# Patient Record
Sex: Male | Born: 1940 | Race: White | Hispanic: No | Marital: Single | Smoking: Never smoker
Health system: Southern US, Community
[De-identification: ages and names within clinical notes are randomized; demographics above are authoritative.]

---

## 2015-04-19 ENCOUNTER — Emergency Department (HOSPITAL_COMMUNITY): Payer: Medicare (Managed Care)

## 2015-04-19 ENCOUNTER — Encounter (HOSPITAL_COMMUNITY): Payer: Self-pay | Admitting: Emergency Medicine

## 2015-04-19 ENCOUNTER — Emergency Department (HOSPITAL_COMMUNITY)
Admission: EM | Admit: 2015-04-19 | Discharge: 2015-04-19 | Disposition: A | Discharge status: Home / Self Care | Payer: Medicare (Managed Care) | Attending: Emergency Medicine | Admitting: Emergency Medicine

## 2015-04-19 DIAGNOSIS — K5732 Diverticulitis of large intestine without perforation or abscess without bleeding: Secondary | ICD-10-CM | POA: Diagnosis not present

## 2015-04-19 DIAGNOSIS — R110 Nausea: Secondary | ICD-10-CM | POA: Insufficient documentation

## 2015-04-19 DIAGNOSIS — R1031 Right lower quadrant pain: Secondary | ICD-10-CM | POA: Diagnosis present

## 2015-04-19 LAB — COMPREHENSIVE METABOLIC PANEL
ALT: 22 U/L (ref 17–63)
AST: 24 U/L (ref 15–41)
Albumin: 4.9 g/dL (ref 3.5–5.0)
Alkaline Phosphatase: 63 U/L (ref 38–126)
Anion gap: 10 (ref 5–15)
BUN: 13 mg/dL (ref 6–20)
CALCIUM: 9.1 mg/dL (ref 8.9–10.3)
CHLORIDE: 97 mmol/L — AB (ref 101–111)
CO2: 28 mmol/L (ref 22–32)
CREATININE: 0.94 mg/dL (ref 0.61–1.24)
Glucose, Bld: 117 mg/dL — ABNORMAL HIGH (ref 65–99)
Potassium: 4.1 mmol/L (ref 3.5–5.1)
Sodium: 135 mmol/L (ref 135–145)
TOTAL PROTEIN: 7.1 g/dL (ref 6.5–8.1)
Total Bilirubin: 1 mg/dL (ref 0.3–1.2)

## 2015-04-19 LAB — CBC
HCT: 46.8 % (ref 39.0–52.0)
Hemoglobin: 15.8 g/dL (ref 13.0–17.0)
MCH: 29.5 pg (ref 26.0–34.0)
MCHC: 33.8 g/dL (ref 30.0–36.0)
MCV: 87.5 fL (ref 78.0–100.0)
PLATELETS: 121 10*3/uL — AB (ref 150–400)
RBC: 5.35 MIL/uL (ref 4.22–5.81)
RDW: 14 % (ref 11.5–15.5)
WBC: 17.6 10*3/uL — AB (ref 4.0–10.5)

## 2015-04-19 LAB — URINALYSIS, ROUTINE W REFLEX MICROSCOPIC
Bilirubin Urine: NEGATIVE
Glucose, UA: NEGATIVE mg/dL
Hgb urine dipstick: NEGATIVE
KETONES UR: NEGATIVE mg/dL
LEUKOCYTES UA: NEGATIVE
NITRITE: NEGATIVE
PROTEIN: NEGATIVE mg/dL
Specific Gravity, Urine: 1.026 (ref 1.005–1.030)
UROBILINOGEN UA: 0.2 mg/dL (ref 0.0–1.0)
pH: 5 (ref 5.0–8.0)

## 2015-04-19 LAB — LIPASE, BLOOD: LIPASE: 24 U/L (ref 22–51)

## 2015-04-19 MED ORDER — HYDROCODONE-ACETAMINOPHEN 5-325 MG PO TABS: 1.0000 | ORAL_TABLET | ORAL | Status: AC | PRN

## 2015-04-19 MED ORDER — ACETAMINOPHEN 325 MG PO TABS
650.0000 mg | ORAL_TABLET | Freq: Once | ORAL | Status: AC
  Administered 2015-04-19: 650 mg via ORAL
  Filled 2015-04-19: qty 2

## 2015-04-19 MED ORDER — SODIUM CHLORIDE 0.9 % IV BOLUS (SEPSIS)
500.0000 mL | Freq: Once | INTRAVENOUS | Status: AC
  Administered 2015-04-19: 500 mL via INTRAVENOUS

## 2015-04-19 MED ORDER — METRONIDAZOLE 500 MG PO TABS
500.0000 mg | ORAL_TABLET | Freq: Once | ORAL | Status: AC
  Administered 2015-04-19: 500 mg via ORAL
  Filled 2015-04-19: qty 1

## 2015-04-19 MED ORDER — MORPHINE SULFATE (PF) 4 MG/ML IV SOLN
4.0000 mg | Freq: Once | INTRAVENOUS | Status: AC
  Administered 2015-04-19: 4 mg via INTRAVENOUS
  Filled 2015-04-19: qty 1

## 2015-04-19 MED ORDER — CIPROFLOXACIN IN D5W 400 MG/200ML IV SOLN
400.0000 mg | Freq: Once | INTRAVENOUS | Status: AC
  Administered 2015-04-19: 400 mg via INTRAVENOUS
  Filled 2015-04-19: qty 200

## 2015-04-19 MED ORDER — IOHEXOL 300 MG/ML  SOLN
100.0000 mL | Freq: Once | INTRAMUSCULAR | Status: AC | PRN
  Administered 2015-04-19: 100 mL via INTRAVENOUS

## 2015-04-19 MED ORDER — CIPROFLOXACIN HCL 500 MG PO TABS: 500.0000 mg | ORAL_TABLET | Freq: Two times a day (BID) | ORAL | Status: AC

## 2015-04-19 MED ORDER — ONDANSETRON HCL 4 MG/2ML IJ SOLN
4.0000 mg | Freq: Once | INTRAMUSCULAR | Status: AC
  Administered 2015-04-19: 4 mg via INTRAVENOUS
  Filled 2015-04-19: qty 2

## 2015-04-19 MED ORDER — METRONIDAZOLE 500 MG PO TABS: 500.0000 mg | ORAL_TABLET | Freq: Three times a day (TID) | ORAL | Status: AC

## 2015-04-19 MED ORDER — IOHEXOL 300 MG/ML  SOLN
25.0000 mL | Freq: Once | INTRAMUSCULAR | Status: AC | PRN
  Administered 2015-04-19: 25 mL via ORAL

## 2015-04-19 NOTE — Discharge Instructions (Signed)
Diverticulitis °Diverticulitis is inflammation or infection of small pouches in your colon that form when you have a condition called diverticulosis. The pouches in your colon are called diverticula. Your colon, or large intestine, is where water is absorbed and stool is formed. °Complications of diverticulitis can include: °· Bleeding. °· Severe infection. °· Severe pain. °· Perforation of your colon. °· Obstruction of your colon. °CAUSES  °Diverticulitis is caused by bacteria. °Diverticulitis happens when stool becomes trapped in diverticula. This allows bacteria to grow in the diverticula, which can lead to inflammation and infection. °RISK FACTORS °People with diverticulosis are at risk for diverticulitis. Eating a diet that does not include enough fiber from fruits and vegetables may make diverticulitis more likely to develop. °SYMPTOMS  °Symptoms of diverticulitis may include: °· Abdominal pain and tenderness. The pain is normally located on the left side of the abdomen, but may occur in other areas. °· Fever and chills. °· Bloating. °· Cramping. °· Nausea. °· Vomiting. °· Constipation. °· Diarrhea. °· Blood in your stool. °DIAGNOSIS  °Your health care provider will ask you about your medical history and do a physical exam. You may need to have tests done because many medical conditions can cause the same symptoms as diverticulitis. Tests may include: °· Blood tests. °· Urine tests. °· Imaging tests of the abdomen, including X-rays and CT scans. °When your condition is under control, your health care provider may recommend that you have a colonoscopy. A colonoscopy can show how severe your diverticula are and whether something else is causing your symptoms. °TREATMENT  °Most cases of diverticulitis are mild and can be treated at home. Treatment may include: °· Taking over-the-counter pain medicines. °· Following a clear liquid diet. °· Taking antibiotic medicines by mouth for 7-10 days. °More severe cases may  be treated at a hospital. Treatment may include: °· Not eating or drinking. °· Taking prescription pain medicine. °· Receiving antibiotic medicines through an IV tube. °· Receiving fluids and nutrition through an IV tube. °· Surgery. °HOME CARE INSTRUCTIONS  °· Follow your health care provider's instructions carefully. °· Follow a full liquid diet or other diet as directed by your health care provider. After your symptoms improve, your health care provider may tell you to change your diet. He or she may recommend you eat a high-fiber diet. Fruits and vegetables are good sources of fiber. Fiber makes it easier to pass stool. °· Take fiber supplements or probiotics as directed by your health care provider. °· Only take medicines as directed by your health care provider. °· Keep all your follow-up appointments. °SEEK MEDICAL CARE IF:  °· Your pain does not improve. °· You have a hard time eating food. °· Your bowel movements do not return to normal. °SEEK IMMEDIATE MEDICAL CARE IF:  °· Your pain becomes worse. °· Your symptoms do not get better. °· Your symptoms suddenly get worse. °· You have a fever. °· You have repeated vomiting. °· You have bloody or black, tarry stools. °MAKE SURE YOU:  °· Understand these instructions. °· Will watch your condition. °· Will get help right away if you are not doing well or get worse. °  °This information is not intended to replace advice given to you by your health care provider. Make sure you discuss any questions you have with your health care provider. °  °Document Released: 03/27/2005 Document Revised: 06/22/2013 Document Reviewed: 05/12/2013 °Elsevier Interactive Patient Education ©2016 [employer] ° °

## 2015-04-19 NOTE — ED Notes (Signed)
Pt. Stated, Im here for the furniture market and started having lower abdominal pain last night.  Probably a urinary thing.

## 2015-04-19 NOTE — ED Notes (Signed)
Pt in CT during rounding.

## 2015-04-19 NOTE — ED Provider Notes (Signed)
CSN: [id number]     Arrival date & time 04/19/15  1059 History   First MD Initiated Contact with Patient 04/19/15 1114     Chief Complaint  Patient presents with  . Abdominal Pain     (Consider location/radiation/quality/duration/timing/severity/associated sxs/prior Treatment) HPI Comments: Patient presents to the emergency room for abdominal pain. Patient reports that he started to experience lower abdominal pain approximately midnight last night. Since that time the pain has progressively worsened. Patient has noticed that the pain is more on the right lower side of his abdomen this morning. He reports that it worsens if he moves or presses on the area. He had increased pain when he hit bumps in the car on the ride to the ER. He has had mild nausea, no vomiting. There is no diarrhea, constipation, urinary symptoms.  Patient is a 74 y.o. male presenting with abdominal pain.  Abdominal Pain   History reviewed. No pertinent past medical history. History reviewed. No pertinent past surgical history. No family history on file. Social History  Substance Use Topics  . Smoking status: Never Smoker   . Smokeless tobacco: None  . Alcohol Use: Yes    Review of Systems  Gastrointestinal: Positive for abdominal pain.  All other systems reviewed and are negative.     Allergies  Review of patient's allergies indicates no known allergies.  Home Medications   Prior to Admission medications   Medication Sig Start Date End Date Taking? Authorizing Provider  ibuprofen (ADVIL,MOTRIN) 200 MG tablet Take 400 mg by mouth every 6 (six) hours as needed for mild pain.   Yes Historical Provider, MD   BP 130/96 mmHg  Pulse 84  Temp(Src) 98.7 F (37.1 C) (Oral)  Resp 27  Ht 5\' 9"  (1.753 m)  Wt 210 lb (95.255 kg)  BMI 31.00 kg/m2  SpO2 98% Physical Exam  Constitutional: He is oriented to person, place, and time. He appears well-developed and well-nourished. No distress.  HENT:  Head:  Normocephalic and atraumatic.  Right Ear: Hearing normal.  Left Ear: Hearing normal.  Nose: Nose normal.  Mouth/Throat: Oropharynx is clear and moist and mucous membranes are normal.  Eyes: Conjunctivae and EOM are normal. Pupils are equal, round, and reactive to light.  Neck: Normal range of motion. Neck supple.  Cardiovascular: Regular rhythm, S1 normal and S2 normal.  Exam reveals no gallop and no friction rub.   No murmur heard. Pulmonary/Chest: Effort normal and breath sounds normal. No respiratory distress. He exhibits no tenderness.  Abdominal: Soft. Normal appearance and bowel sounds are normal. There is no hepatosplenomegaly. There is tenderness in the right lower quadrant. There is guarding. There is no rebound, no tenderness at McBurney's point and negative Murphy's sign. No hernia.  Musculoskeletal: Normal range of motion.  Neurological: He is alert and oriented to person, place, and time. He has normal strength. No cranial nerve deficit or sensory deficit. Coordination normal. GCS eye subscore is 4. GCS verbal subscore is 5. GCS motor subscore is 6.  Skin: Skin is warm, dry and intact. No rash noted. No cyanosis.  Psychiatric: He has a normal mood and affect. His speech is normal and behavior is normal. Thought content normal.  Nursing note and vitals reviewed.   ED Course  Procedures (including critical care time) Labs Review Labs Reviewed  COMPREHENSIVE METABOLIC PANEL - Abnormal; Notable for the following:    Chloride 97 (*)    Glucose, Bld 117 (*)    All other components within normal limits  CBC - Abnormal; Notable for the following:    WBC 17.6 (*)    Platelets 121 (*)    All other components within normal limits  URINALYSIS, ROUTINE W REFLEX MICROSCOPIC (NOT AT [facility]) - Abnormal; Notable for the following:    Color, Urine AMBER (*)    All other components within normal limits  LIPASE, BLOOD    Imaging Review Ct Abdomen Pelvis W Contrast  04/19/2015  CLINICAL  DATA:  Lower abdominal pain since last night. No nausea or vomiting. EXAM: CT ABDOMEN AND PELVIS WITH CONTRAST TECHNIQUE: Multidetector CT imaging of the abdomen and pelvis was performed using the standard protocol following bolus administration of intravenous contrast. CONTRAST:  OMNIPAQUE IOHEXOL 300 MG/ML  SOLN COMPARISON:  None. FINDINGS: Lower chest: Mild chronic interstitial disease at the lung bases. Normal heart size. Hepatobiliary: 8.7 mm hypodensity in the right hepatic lobe too small to characterize likely representing a small cyst. Liver is otherwise normal. Normal gallbladder. No intrahepatic or extrahepatic biliary ductal dilatation. Pancreas: Normal. Spleen: Normal. Adrenals/Urinary Tract: Normal adrenal glands. Normal right kidney. 2.3 cm hypodense, fluid attenuating left renal mass, most consistent with a cyst. Bladder wall thickening. Stomach/Bowel: No bowel dilatation to suggest obstruction. Diverticulosis of the sigmoid colon with perisigmoid ule inflammatory changes most consistent with acute diverticulitis. Normal appendix. There is no focal fluid collection to suggest an abscess. There is no pneumatosis, pneumoperitoneum portal venous gas. There is no abdominal or pelvic free fluid. Vascular/Lymphatic: Normal caliber abdominal aorta with atherosclerosis. No abdominal or pelvic lymphadenopathy. Other: No focal fluid collection or hematoma. Musculoskeletal: No lytic or sclerotic osseous lesion. No acute osseous abnormality. Mild degenerative changes of bilateral SI joints. Grade 1 anterolisthesis of L4 on L5 secondary to bilateral facet arthropathy. Severe bilateral facet arthropathy at L4-5 and L5-S1. Degenerative disc disease with disc height loss and a broad-based disc bulge at L5-S1. IMPRESSION: 1. Acute diverticulitis of the sigmoid colon. No focal fluid collection to suggest an abscess. The area of inflammation is adjacent to the dome of the bladder and likely resulting in reactive  bladder wall thickening. 2. Lumbar spine spondylosis. Electronically Signed   By: Elige Ko   On: 04/19/2015 14:00   I have personally reviewed and evaluated these images and lab results as part of my medical decision-making.   EKG Interpretation None      MDM   Final diagnoses:  Diverticulitis of large intestine without perforation or abscess without bleeding    Patient presents to the emergency for evaluation of abdominal pain. Patient began to experience lower abdominal pain last night. Patient reports that the pain is worsened today and has become more focal to the right lower abdomen. Examination revealed tenderness and guarding in the right lower quadrant with diffuse lower abdominal tenderness on bilateral sides. Patient does have leukocytosis. He is afebrile. Remainder of blood work was unremarkable. CT scan performed. Findings are consistent with acute sigmoid diverticulitis. Patient initiated on Cipro, Flagyl and analgesia. He is tolerating by mouth, is otherwise healthy. He is a candidate for continued outpatient treatment for acute diverticulitis.    Gilda Crease, MD 04/19/15 [phone]

## 2016-09-20 IMAGING — CT CT ABD-PELV W/ CM
2 of 5 series · 16 of 46 positions shown, 18 images · IV contrast (Omni 300)
Comparison: None.

CLINICAL DATA: Lower abdominal pain since last night. No nausea or
vomiting.

EXAM:
CT ABDOMEN AND PELVIS WITH CONTRAST
TECHNIQUE: Multidetector CT imaging of the abdomen and pelvis was performed
using the standard protocol following bolus administration of
intravenous contrast.
CONTRAST:  100mL OMNIPAQUE IOHEXOL 300 MG/ML  SOLN

[Series 2: a/p w/ 5mm · axial · 0.82mm/px · z∈[-493,-33]mm · 13 of 104 slices shown, 15 images]
[im 6/104  soft-tissue]
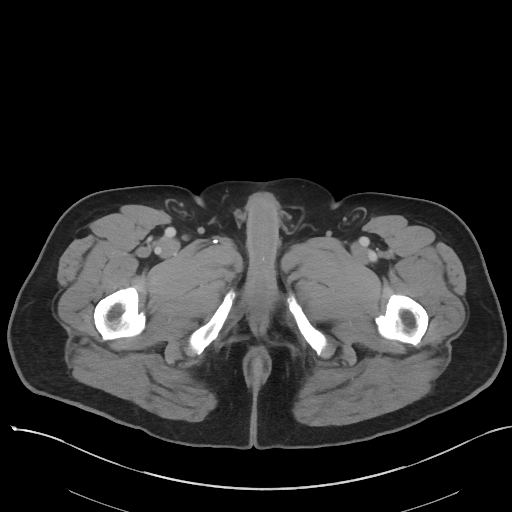
[im 6/104  bone]
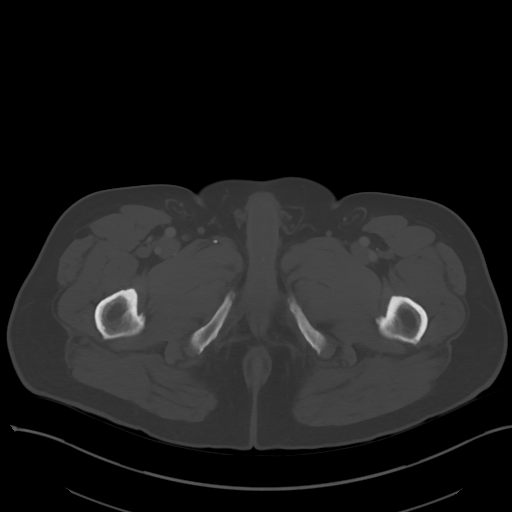
[im 12/104  soft-tissue]
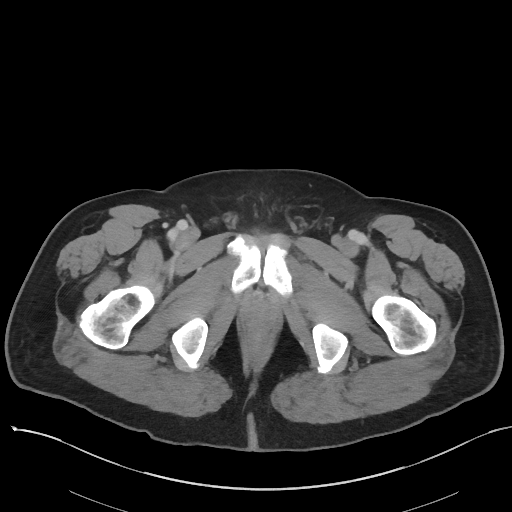
[im 23/104  soft-tissue]
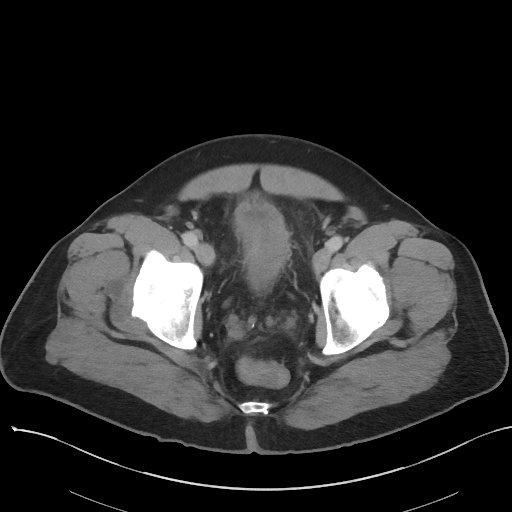
[im 29/104  soft-tissue]
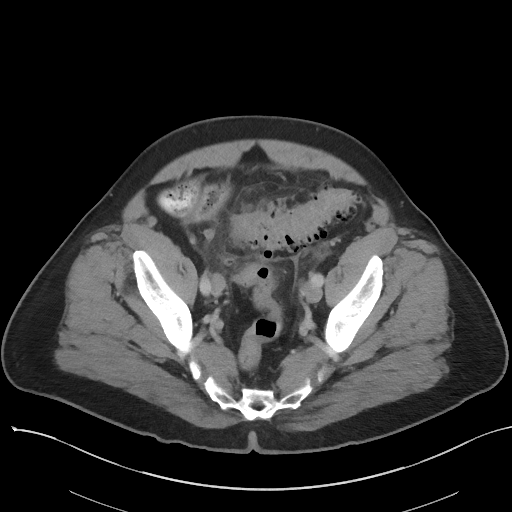
[im 35/104  soft-tissue]
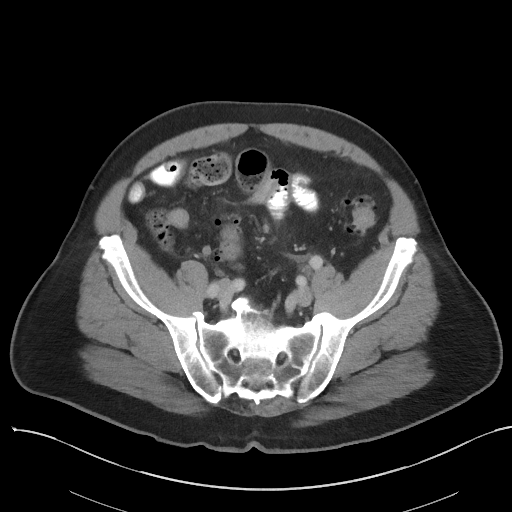
[im 46/104  soft-tissue]
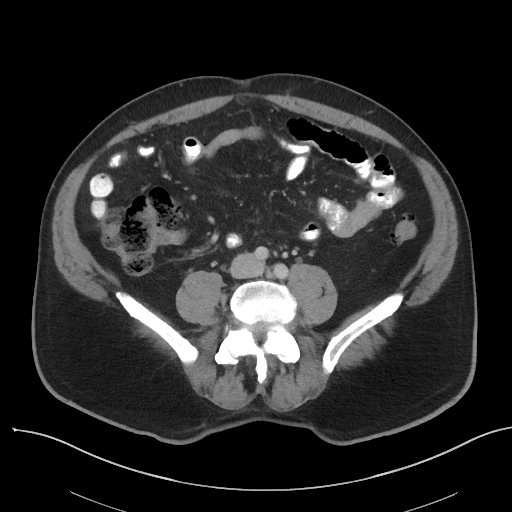
[im 52/104  soft-tissue]
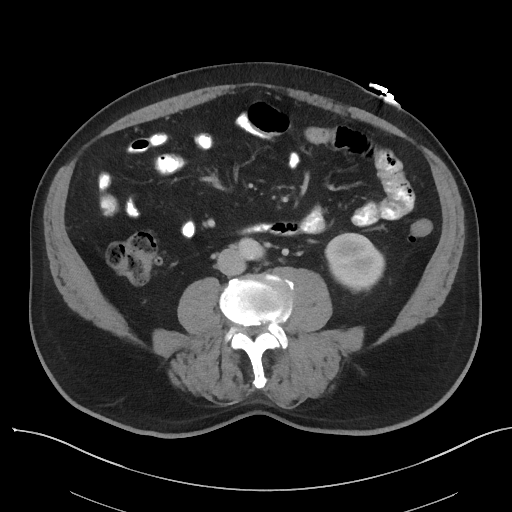
[im 58/104  soft-tissue]
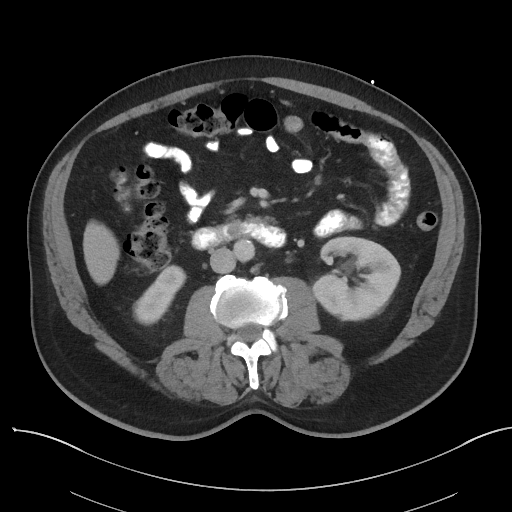
[im 69/104  soft-tissue]
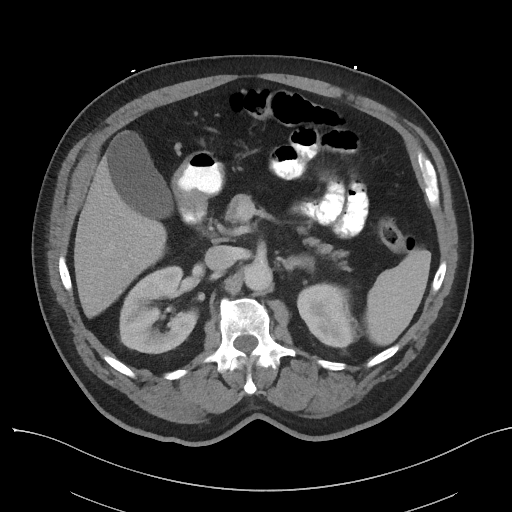
[im 69/104  bone]
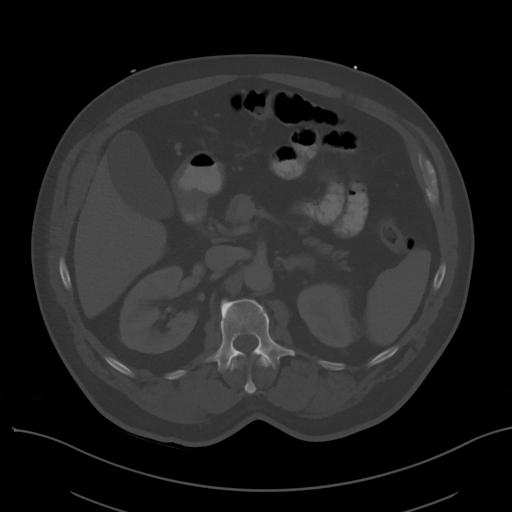
[im 75/104  soft-tissue]
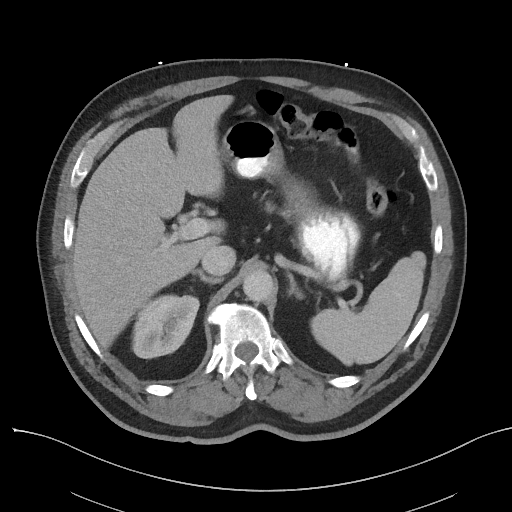
[im 81/104  soft-tissue]
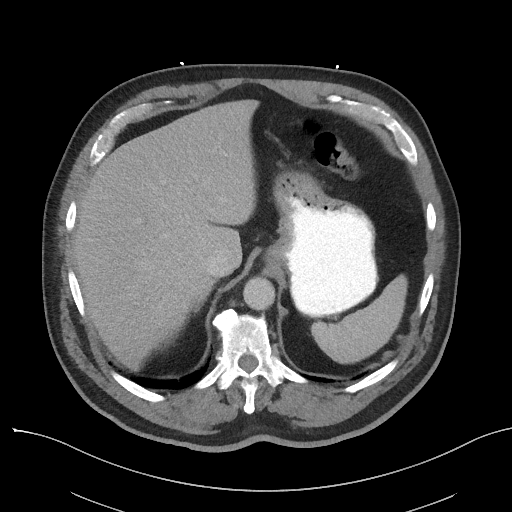
[im 92/104  soft-tissue]
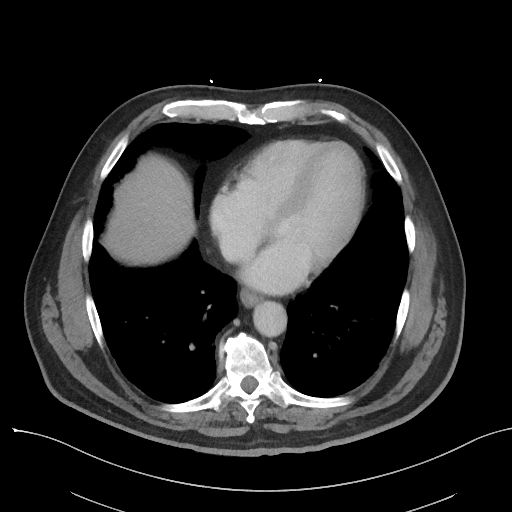
[im 98/104  soft-tissue]
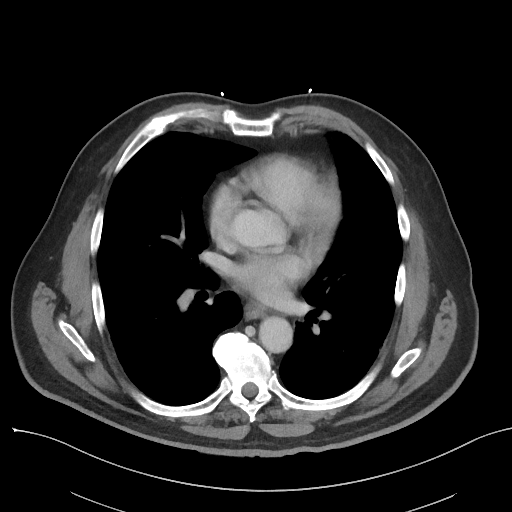

[Series 5: a/p w/ cor · coronal · 0.78mm/px · 3 of 158 slices shown]
[im 53/158  soft-tissue]
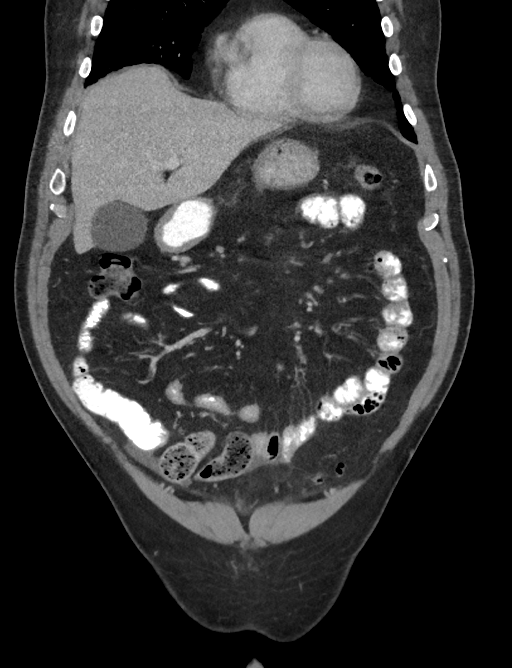
[im 70/158  soft-tissue]
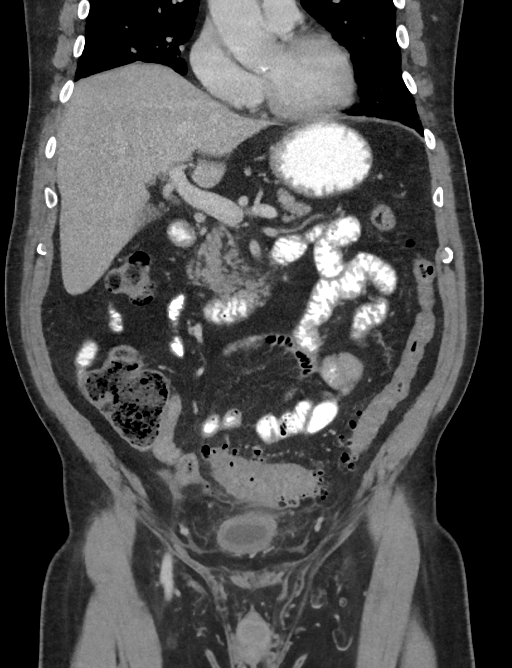
[im 88/158  soft-tissue]
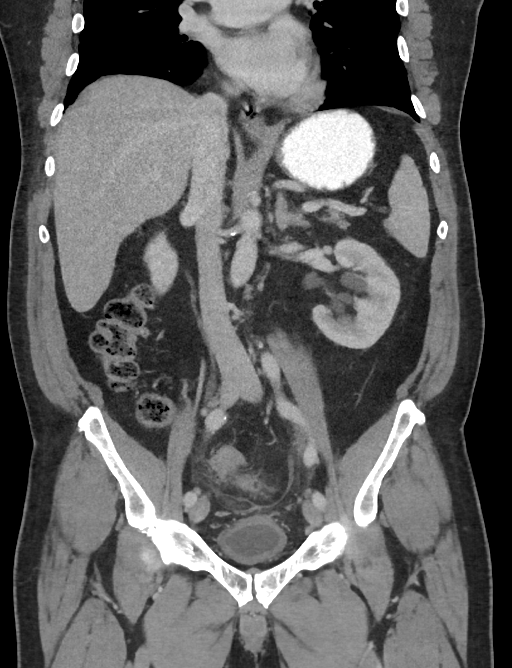

[16 of 46 positions shown; findings below may reference images not displayed]

FINDINGS: Lower chest: Mild chronic interstitial disease at the lung bases.
Normal heart size.

Hepatobiliary: 8.7 mm hypodensity in the right hepatic lobe too
small to characterize likely representing a small cyst. Liver is
otherwise normal. Normal gallbladder. No intrahepatic or
extrahepatic biliary ductal dilatation.

Pancreas: Normal.

Spleen: Normal.

Adrenals/Urinary Tract: Normal adrenal glands. Normal right kidney.
2.3 cm hypodense, fluid attenuating left renal mass, most consistent
with a cyst. Bladder wall thickening.

Stomach/Bowel: No bowel dilatation to suggest obstruction.
Diverticulosis of the sigmoid colon with perisigmoid ule
inflammatory changes most consistent with acute diverticulitis.
Normal appendix. There is no focal fluid collection to suggest an
abscess. There is no pneumatosis, pneumoperitoneum portal venous
gas. There is no abdominal or pelvic free fluid.

Vascular/Lymphatic: Normal caliber abdominal aorta with
atherosclerosis. No abdominal or pelvic lymphadenopathy.

Other: No focal fluid collection or hematoma.

Musculoskeletal: No lytic or sclerotic osseous lesion. No acute
osseous abnormality. Mild degenerative changes of bilateral SI
joints. Grade 1 anterolisthesis of L4 on L5 secondary to bilateral
facet arthropathy. Severe bilateral facet arthropathy at L4-5 and
L5-S1. Degenerative disc disease with disc height loss and a
broad-based disc bulge at L5-S1.
IMPRESSION: 1. Acute diverticulitis of the sigmoid colon. No focal fluid
collection to suggest an abscess. The area of inflammation is
adjacent to the dome of the bladder and likely resulting in reactive
bladder wall thickening.
2. Lumbar spine spondylosis.
